# Patient Record
Sex: Female | Born: 1980 | Race: Black or African American | Hispanic: No | Marital: Married | Smoking: Never smoker
Health system: Southern US, Community
[De-identification: ages and names within clinical notes are randomized; demographics above are authoritative.]

## PROBLEM LIST (undated history)

## (undated) HISTORY — PX: OTHER SURGICAL HISTORY: SHX169

## (undated) HISTORY — PX: CHOLECYSTECTOMY: SHX55

## (undated) HISTORY — PX: APPENDECTOMY: SHX54

---

## 2017-07-17 ENCOUNTER — Encounter (HOSPITAL_COMMUNITY): Payer: Self-pay | Admitting: Emergency Medicine

## 2017-07-17 ENCOUNTER — Emergency Department (HOSPITAL_COMMUNITY): Payer: Self-pay

## 2017-07-17 ENCOUNTER — Other Ambulatory Visit: Payer: Self-pay

## 2017-07-17 ENCOUNTER — Emergency Department (HOSPITAL_COMMUNITY)
Admission: EM | Admit: 2017-07-17 | Discharge: 2017-07-17 | Disposition: A | Discharge status: Home / Self Care | Payer: Self-pay | Attending: Emergency Medicine | Admitting: Emergency Medicine

## 2017-07-17 DIAGNOSIS — R05 Cough: Secondary | ICD-10-CM | POA: Insufficient documentation

## 2017-07-17 DIAGNOSIS — R0981 Nasal congestion: Secondary | ICD-10-CM | POA: Insufficient documentation

## 2017-07-17 DIAGNOSIS — R6889 Other general symptoms and signs: Secondary | ICD-10-CM

## 2017-07-17 DIAGNOSIS — R509 Fever, unspecified: Secondary | ICD-10-CM | POA: Insufficient documentation

## 2017-07-17 LAB — POC URINE PREG, ED: PREG TEST UR: NEGATIVE

## 2017-07-17 MED ORDER — OSELTAMIVIR PHOSPHATE 75 MG PO CAPS: 75.0000 mg | ORAL_CAPSULE | Freq: Two times a day (BID) | ORAL | 0 refills | Status: AC

## 2017-07-17 MED ORDER — ACETAMINOPHEN 325 MG PO TABS
650.0000 mg | ORAL_TABLET | Freq: Once | ORAL | Status: AC
  Administered 2017-07-17: 650 mg via ORAL
  Filled 2017-07-17: qty 2

## 2017-07-17 NOTE — ED Notes (Signed)
ED Provider at bedside. 

## 2017-07-17 NOTE — ED Notes (Signed)
Patient transported to X-ray 

## 2017-07-17 NOTE — Discharge Instructions (Signed)
Take Tamiflu as prescribed.  Drink plenty of fluids and get plenty of rest. Alternate 600 mg of ibuprofen and [phone] mg of Tylenol every 3 hours as needed for pain and fever. Do not exceed 4000 mg of Tylenol daily.  Follow-up with your primary care physician for reevaluation of your symptoms.  Return to the emergency department if any concerning signs or symptoms develop such as fever greater than 102 F not controlled by ibuprofen or Tylenol, inability to tolerate food or fluids, or severe chest pain or shortness of breath.

## 2017-07-17 NOTE — ED Provider Notes (Signed)
[facility] EMERGENCY DEPARTMENT Provider Note   CSN: [id number] Arrival date & time: 07/17/17  2115     History   Chief Complaint Chief Complaint  Patient presents with  . Influenza    HPI Denise Aguilar is a 37 y.o. female no pertinent past medical history presents today for evaluation of acute onset, progressively worsening fevers and myalgias which began yesterday at 4:30 PM.  She states that her temperature was around 101 F.  She was seen and evaluated at her office by a nurse who gave her Tylenol and recommended further medical evaluation.  She endorses aching generalized myalgias primarily localized to her joints.  Denies neck pain or neck stiffness.  She endorses nasal congestion and mild sore throat with cough.  Cough is nonproductive.  She denies shortness of breath or chest pain.  She denies abdominal pain, nausea, vomiting, or diarrhea.  She has only tried Tylenol for her symptoms.  She is unsure if she has had any sick contacts.  She is a non-smoker.  She states she feels miserable.  The history is provided by the patient.    History reviewed. No pertinent past medical history.  There are no active problems to display for this patient.   Past Surgical History:  Procedure Laterality Date  . APPENDECTOMY    . CHOLECYSTECTOMY    . skin graft      OB History    No data available       Home Medications    Prior to Admission medications   Medication Sig Start Date End Date Taking? Authorizing Provider  oseltamivir (TAMIFLU) 75 MG capsule Take 1 capsule (75 mg total) by mouth every 12 (twelve) hours for 5 days. 07/17/17 07/22/17  Jeanie Sewer, PA-C    Family History No family history on file.  Social History Social History   Tobacco Use  . Smoking status: Never Smoker  . Smokeless tobacco: Never Used  Substance Use Topics  . Alcohol use: No    Frequency: Never  . Drug use: No     Allergies   Patient has no known  allergies.   Review of Systems Review of Systems  Constitutional: Positive for chills and fever.  HENT: Positive for congestion and sore throat (With cough only).   Respiratory: Positive for cough. Negative for shortness of breath.   Cardiovascular: Negative for chest pain.  Musculoskeletal: Positive for arthralgias. Negative for neck pain and neck stiffness.  All other systems reviewed and are negative.    Physical Exam Updated Vital Signs BP 130/85 (BP Location: Left Arm)   Pulse 76   Temp 99 F (37.2 C) (Oral)   Resp 15   Ht 5\' 11"  (1.803 m)   Wt 113.4 kg (250 lb)   LMP 07/02/2017 Comment: neg preg test  SpO2 100%   BMI 34.87 kg/m   Physical Exam  Constitutional: She appears well-developed and well-nourished. No distress.  HENT:  Head: Normocephalic and atraumatic.  Right Ear: External ear normal.  Left Ear: External ear normal.  TMs without erythema or bulging bilaterally.  Nasal septum is midline with mucosal edema bilaterally.  Posterior oropharynx with postnasal drip, no tonsillar hypertrophy, exudates, erythema, or uvular deviation.  No trismus.  Eyes: Conjunctivae and EOM are normal. Pupils are equal, round, and reactive to light. Right eye exhibits no discharge. Left eye exhibits no discharge.  Neck: Normal range of motion. Neck supple. No JVD present. No tracheal deviation present.  Bilateral anterior cervical lymphadenopathy.  No midline spine TTP, no paraspinal muscle tenderness, no deformity, crepitus, or step-off noted   Cardiovascular: Normal rate, regular rhythm, normal heart sounds and intact distal pulses.  Pulmonary/Chest: Effort normal and breath sounds normal. No stridor. No respiratory distress. She has no wheezes. She has no rales. She exhibits no tenderness.  Abdominal: Soft. Bowel sounds are normal. She exhibits no distension. There is no tenderness.  Musculoskeletal: She exhibits no edema.  Lymphadenopathy:    She has cervical adenopathy.   Neurological: She is alert.  Skin: Skin is warm and dry. No erythema.  Psychiatric: She has a normal mood and affect. Her behavior is normal.  Nursing note and vitals reviewed.    ED Treatments / Results  Labs (all labs ordered are listed, but only abnormal results are displayed) Labs Reviewed  RESPIRATORY PANEL BY PCR  POC URINE PREG, ED    EKG  EKG Interpretation None       Radiology Dg Chest 2 View  Result Date: 07/17/2017 CLINICAL DATA:  Fever. EXAM: CHEST  2 VIEW COMPARISON:  None. FINDINGS: The heart size and mediastinal contours are within normal limits. No pneumothorax or pleural effusion is noted. Left lung is clear. Small opacity is seen laterally in right lung base concerning for scarring or subsegmental atelectasis. The visualized skeletal structures are unremarkable. IMPRESSION: Small focus of scarring or subsegmental atelectasis seen laterally in right lung base. Electronically Signed   By: Lupita Raider, M.D.   On: 07/17/2017 23:12    Procedures Procedures (including critical care time)  Medications Ordered in ED Medications  acetaminophen (TYLENOL) tablet 650 mg (650 mg Oral Given 07/17/17 2152)     Initial Impression / Assessment and Plan / ED Course  I have reviewed the triage vital signs and the nursing notes.  Pertinent labs & imaging results that were available during my care of the patient were reviewed by me and considered in my medical decision making (see chart for details).     Patient presents with acute onset of flulike symptoms which began yesterday at 4:30 PM.  Initially febrile at 100.7 F and mildly hypertensive while in the ED with improvement after administration of Tylenol.  She is uncomfortable but nontoxic in appearance.  Chest x-ray shows no evidence of pneumonia or pulmonary edema.  She has nasal congestion on examination.  I doubt PE or ACS or MI in the absence of shortness of breath or chest pain.  Examination is unconcerning for  strep pharyngitis or PTA.  No meningeal signs to suggest meningitis.  Given the duration of symptoms, it is not unreasonable to treat clinically for influenza.  I had a long discussion with the patient regarding the risks and benefits of Tamiflu and she is requesting to be discharged with a prescription of Tamiflu which I think is reasonable given that she is "feeling miserable ".  Discussed symptomatic treatment with ibuprofen, Tylenol, fluids, and rest.  Recommend follow-up with primary care physician for reevaluation.  Discussed indications for return to the ED. Pt verbalized understanding of and agreement with plan and is safe for discharge home at this time.  She has no complaints prior to discharge.  Final Clinical Impressions(s) / ED Diagnoses   Final diagnoses:  Flu-like symptoms    ED Discharge Orders        Ordered    oseltamivir (TAMIFLU) 75 MG capsule  Every 12 hours     07/17/17 2335       Jeanie Sewer, PA-C 07/18/17  0107    Wynetta FinesMessick, Peter C, MD 07/18/17 [phone]

## 2017-07-17 NOTE — ED Triage Notes (Signed)
Pt c/o cold s/s, fever, body aches, generalized pain onset yesterday. Denies n/v/d.

## 2017-07-18 LAB — RESPIRATORY PANEL BY PCR
Adenovirus: NOT DETECTED
BORDETELLA PERTUSSIS-RVPCR: NOT DETECTED
CHLAMYDOPHILA PNEUMONIAE-RVPPCR: NOT DETECTED
Coronavirus 229E: NOT DETECTED
Coronavirus HKU1: NOT DETECTED
Coronavirus NL63: NOT DETECTED
Coronavirus OC43: NOT DETECTED
INFLUENZA A-RVPPCR: DETECTED — AB
INFLUENZA B-RVPPCR: NOT DETECTED
MYCOPLASMA PNEUMONIAE-RVPPCR: NOT DETECTED
Metapneumovirus: NOT DETECTED
PARAINFLUENZA VIRUS 3-RVPPCR: NOT DETECTED
PARAINFLUENZA VIRUS 4-RVPPCR: NOT DETECTED
Parainfluenza Virus 1: NOT DETECTED
Parainfluenza Virus 2: NOT DETECTED
RESPIRATORY SYNCYTIAL VIRUS-RVPPCR: NOT DETECTED
RHINOVIRUS / ENTEROVIRUS - RVPPCR: NOT DETECTED

## 2019-05-12 IMAGING — DX DG CHEST 2V
2 series · 2 of 2 positions shown · non-contrast
Comparison: None.

CLINICAL DATA: Fever.

EXAM:
CHEST  2 VIEW

[chest pa]
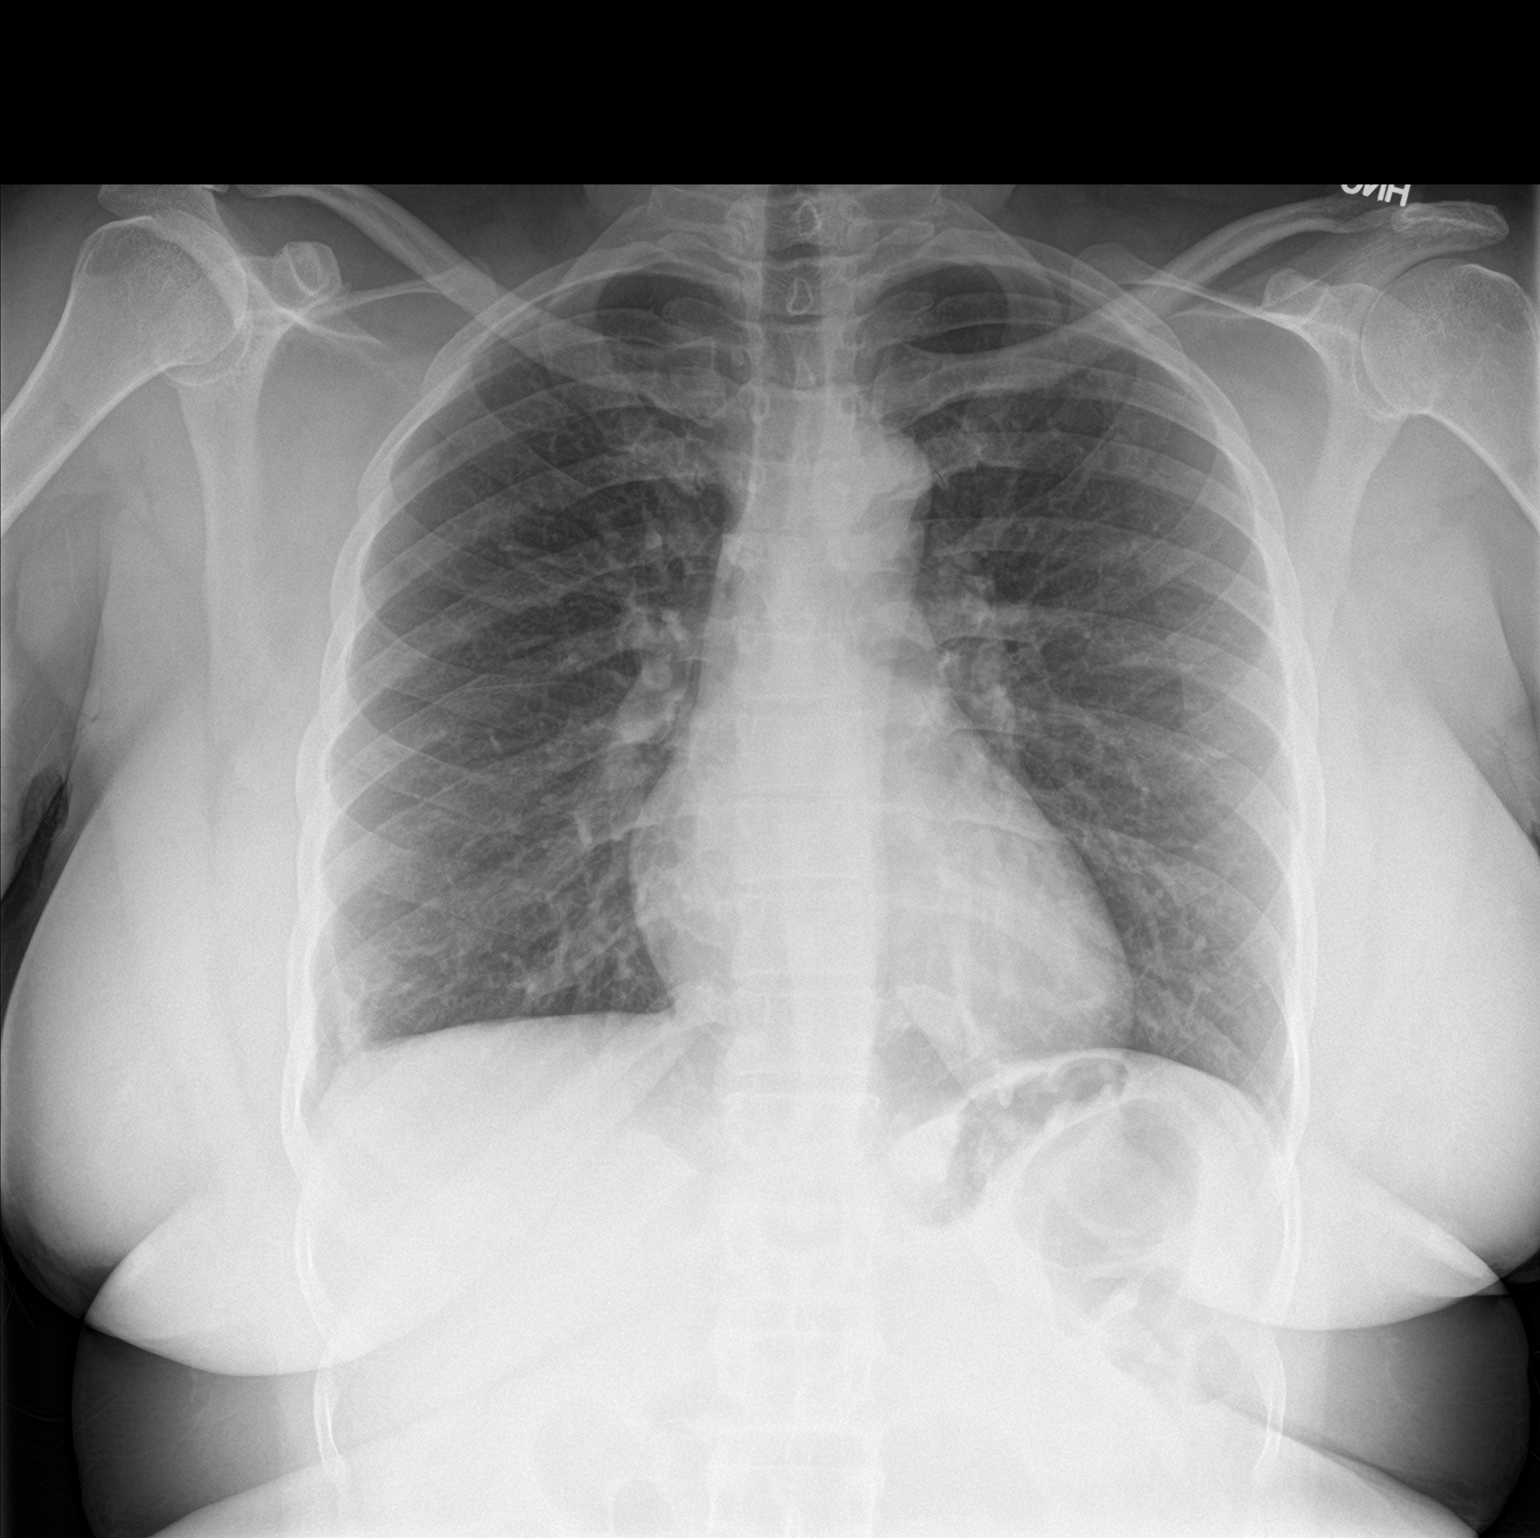

[chest lat]
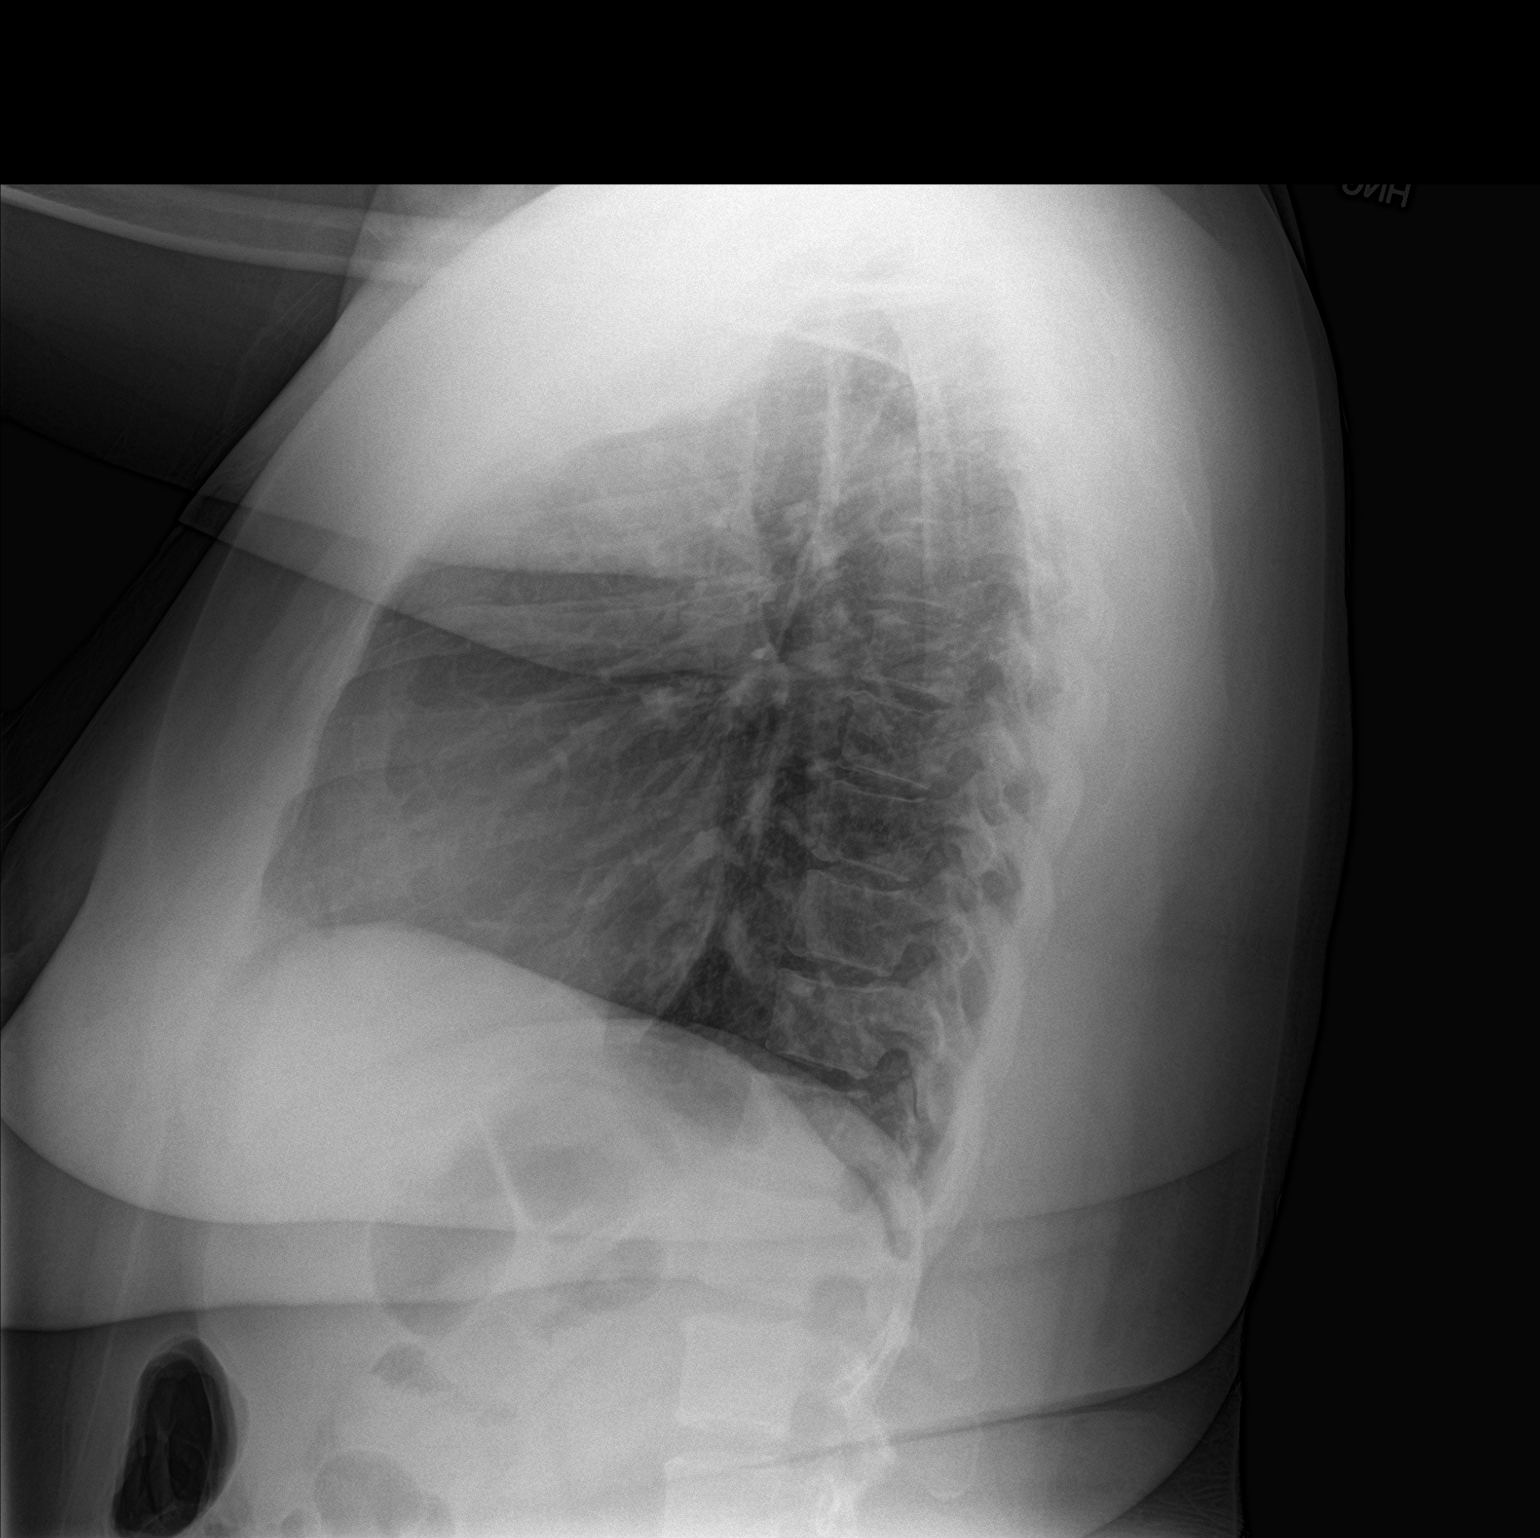

[2 of 2 positions shown; findings below may reference images not displayed]

FINDINGS: The heart size and mediastinal contours are within normal limits. No
pneumothorax or pleural effusion is noted. Left lung is clear. Small
opacity is seen laterally in right lung base concerning for scarring
or subsegmental atelectasis. The visualized skeletal structures are
unremarkable.
IMPRESSION: Small focus of scarring or subsegmental atelectasis seen laterally
in right lung base.
